# Patient Record
Sex: Female | Born: 1955 | Race: White | Hispanic: No | Marital: Married | Smoking: Former smoker
Health system: Southern US, Community
[De-identification: ages and names within clinical notes are randomized; demographics above are authoritative.]

## PROBLEM LIST (undated history)

## (undated) DIAGNOSIS — G629 Polyneuropathy, unspecified: Secondary | ICD-10-CM

## (undated) HISTORY — DX: Polyneuropathy, unspecified: G62.9

---

## 2014-07-24 ENCOUNTER — Other Ambulatory Visit (HOSPITAL_COMMUNITY): Payer: Self-pay | Admitting: Obstetrics and Gynecology

## 2014-07-24 DIAGNOSIS — E041 Nontoxic single thyroid nodule: Secondary | ICD-10-CM

## 2014-07-27 ENCOUNTER — Ambulatory Visit (HOSPITAL_COMMUNITY)
Admission: RE | Admit: 2014-07-27 | Discharge: 2014-07-27 | Disposition: A | Discharge status: Home / Self Care | Payer: Managed Care, Other (non HMO) | Source: Ambulatory Visit | Attending: Obstetrics and Gynecology | Admitting: Obstetrics and Gynecology

## 2014-07-27 DIAGNOSIS — E042 Nontoxic multinodular goiter: Secondary | ICD-10-CM | POA: Insufficient documentation

## 2014-07-27 DIAGNOSIS — E041 Nontoxic single thyroid nodule: Secondary | ICD-10-CM

## 2016-01-17 ENCOUNTER — Other Ambulatory Visit: Payer: Self-pay | Admitting: Urology

## 2016-01-17 DIAGNOSIS — D3500 Benign neoplasm of unspecified adrenal gland: Secondary | ICD-10-CM

## 2016-02-24 ENCOUNTER — Ambulatory Visit (HOSPITAL_COMMUNITY): Payer: Managed Care, Other (non HMO)

## 2016-03-02 ENCOUNTER — Ambulatory Visit (HOSPITAL_COMMUNITY): Payer: Managed Care, Other (non HMO)

## 2016-03-09 ENCOUNTER — Ambulatory Visit (HOSPITAL_COMMUNITY): Admission: RE | Admit: 2016-03-09 | Payer: Managed Care, Other (non HMO) | Source: Ambulatory Visit

## 2018-10-25 ENCOUNTER — Telehealth: Payer: Self-pay

## 2018-10-25 NOTE — Telephone Encounter (Signed)
NOTES ON FILE FROM Charlesetta Ivory MD [phone], REFERRAL SENT TO SCHEDULING

## 2018-12-27 ENCOUNTER — Encounter: Payer: Self-pay | Admitting: Cardiology

## 2018-12-27 ENCOUNTER — Ambulatory Visit (INDEPENDENT_AMBULATORY_CARE_PROVIDER_SITE_OTHER): Payer: Managed Care, Other (non HMO) | Admitting: Cardiology

## 2018-12-27 ENCOUNTER — Other Ambulatory Visit: Payer: Self-pay

## 2018-12-27 VITALS — BP 136/70 | HR 85 | Temp 97.1°F | Ht 71.5 in | Wt 182.0 lb

## 2018-12-27 DIAGNOSIS — Z131 Encounter for screening for diabetes mellitus: Secondary | ICD-10-CM | POA: Diagnosis not present

## 2018-12-27 DIAGNOSIS — Z7182 Exercise counseling: Secondary | ICD-10-CM

## 2018-12-27 DIAGNOSIS — G629 Polyneuropathy, unspecified: Secondary | ICD-10-CM | POA: Diagnosis not present

## 2018-12-27 DIAGNOSIS — I10 Essential (primary) hypertension: Secondary | ICD-10-CM

## 2018-12-27 DIAGNOSIS — Z79899 Other long term (current) drug therapy: Secondary | ICD-10-CM | POA: Diagnosis not present

## 2018-12-27 DIAGNOSIS — E782 Mixed hyperlipidemia: Secondary | ICD-10-CM

## 2018-12-27 DIAGNOSIS — E785 Hyperlipidemia, unspecified: Secondary | ICD-10-CM

## 2018-12-27 DIAGNOSIS — Z7189 Other specified counseling: Secondary | ICD-10-CM

## 2018-12-27 DIAGNOSIS — Z713 Dietary counseling and surveillance: Secondary | ICD-10-CM

## 2018-12-27 DIAGNOSIS — Z8249 Family history of ischemic heart disease and other diseases of the circulatory system: Secondary | ICD-10-CM

## 2018-12-27 MED ORDER — ATORVASTATIN CALCIUM 40 MG PO TABS: 40.0000 mg | ORAL_TABLET | Freq: Every day | ORAL | 3 refills | Status: DC

## 2018-12-27 NOTE — Patient Instructions (Addendum)
Medication Instructions:  Start 10 mg atorvastatin daily Week two, go to 20 mg daily Week three, go to 30 mg daily Week four, go to 40 mg daily  If you need a refill on your cardiac medications before your next appointment, please call your pharmacy.   Lab work: Your physician recommends that you return for lab work in 3 months ( Lipid, LFT, A1C)  If you have labs (blood work) drawn today and your tests are completely normal, you will receive your results only by: Marland Kitchen MyChart Message (if you have MyChart) OR . A paper copy in the mail If you have any lab test that is abnormal or we need to change your treatment, we will call you to review the results.  Testing/Procedures: None  Follow-Up: At [facility], you and your health needs are our priority.  As part of our continuing mission to provide you with exceptional heart care, we have created designated Provider Care Teams.  These Care Teams include your primary Cardiologist (physician) and Advanced Practice Providers (APPs -  Physician Assistants and Nurse Practitioners) who all work together to provide you with the care you need, when you need it. You will need a follow up appointment in 3 months.  Please call our office 2 months in advance to schedule this appointment.  You may see Dr. Harrell Gave or one of the following Advanced Practice Providers on your designated Care Team:   Rosaria Ferries, PA-C . Jory Sims, DNP, ANP

## 2018-12-27 NOTE — Progress Notes (Signed)
Cardiology Office Note:    Date:  12/27/2018   ID:  Kimberly Vaughan, DOB 1956-02-12, MRN [id number]  PCP:  Maryruth Eve, MD  Cardiologist:  Buford Dresser, MD  Referring MD: Maryruth Eve, MD   CC: new patient consult for hyperlipidemia  History of Present Illness:    Kimberly Vaughan is a 62 y.o. female with a hx of neuropathy, hypertriglyceridemia who is seen as a new consult at the request of Maryruth Eve, MD for the evaluation and management of hyperlipidemia.  Sees Dr. Helane Rima with [facility]. Has neuropathy in the feet, numb up to mid calves. 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 Also has thyroid nodules. Falls asleep easily on gabapentin, but feels she cannot take other than bedtime.   She is concerned as to whether her hypertriglyceridemia is the cause/result of any of her current issues.  CV history: Admitted to CCU for 2 days for abnomal ECG in 2014. Was told it was just stress.  Has been on atorvastatin and simvastatin in the past. Her last A1c was 5.2. Very nervous about getting diabetes. Walking frequently, 30 min/day. Wearing compression stockings. HTN: checks at home, 120/70s-80s  Denies chest pain, shortness of breath at rest or with normal exertion. No PND, orthopnea, LE edema or unexpected weight gain. No syncope or palpitations.  Family history: Brother had diabetes, LVAD. Brother with diabetes, dialysis, liver cancer. Father died 54 years ago, had diabetes and cholesterol issues.   Brings copies of labs with her: 2014 Tchol 223, TG 285, HDL 43, LDL 123 (before atorvastatin, fasting)  02/11/2015 Tchol 211, LDL 120, TG 218, HDL 43 (no meds, fasting)  10/17/18 Tchol 260, TG 520, HDL 36 (fasting, no meds)   We discussed CV risk, lipids at length today. Summary of plan below.  Past Medical History:  Diagnosis Date  .  Neuropathy     History reviewed. No pertinent surgical history.  Current Medications: Current Outpatient Medications on File Prior to Visit  Medication Sig  . Ascorbic Acid (VITAMIN C) 1000 MG tablet Take by mouth.  . estradiol (ESTRACE) 0.1 MG/GM vaginal cream Place vaginally.  Marland Kitchen FLUoxetine (PROZAC) 20 MG tablet   . gabapentin (NEURONTIN) 300 MG capsule Start with 300 mg at night x 1 week, then 300 mg twice daily x 1 week then 300 mg 3 times per day  . Multiple Vitamin (MULTIVITAMIN) capsule Take by mouth.  . Omega-3 1000 MG CAPS Take by mouth.   No current facility-administered medications on file prior to visit.      Allergies:   Latex   Social History   Tobacco Use  . Smoking status: Former Smoker    Quit date: 2010    Years since quitting: 10.7  . Smokeless tobacco: Never Used  Substance Use Topics  . Alcohol use: Not on file  . Drug use: Not on file    Family History: family history is not on file.  ROS:   Please see the history of present illness.  Additional pertinent ROS: Constitutional: Negative for chills, fever, night sweats, unintentional weight loss  HENT: Negative for ear pain and hearing loss.   Eyes: Negative for loss of vision and eye pain.  Respiratory: Negative for cough, sputum, wheezing.   Cardiovascular: See HPI. Gastrointestinal: Negative for abdominal pain, melena, and hematochezia.  Genitourinary: Negative for dysuria and hematuria.  Musculoskeletal: Negative for falls and myalgias.  Skin: Negative for itching and rash.  Neurological: Negative for focal weakness, focal sensory changes and loss of consciousness.  Endo/Heme/Allergies: Does not bruise/bleed easily.     EKGs/Labs/Other Studies Reviewed:    The following studies were reviewed today: No prior cardiac studies  EKG:  EKG is personally reviewed.  The ekg ordered today demonstrates NSR, borderline criteria for LVH  Recent Labs: No results found for requested labs within last  8760 hours.  Recent Lipid Panel No results found for: CHOL, TRIG, HDL, CHOLHDL, VLDL, LDLCALC, LDLDIRECT  Physical Exam:    VS:  BP 136/70   Pulse 85   Temp (!) 97.1 F (36.2 C)   Ht 5' 11.5" (1.816 m)   Wt 182 lb (82.6 kg)   SpO2 95%   BMI 25.03 kg/m     Wt Readings from Last 3 Encounters:  12/27/18 182 lb (82.6 kg)    GEN: Well nourished, well developed in no acute distress HEENT: Normal, moist mucous membranes NECK: No JVD CARDIAC: regular rhythm, normal S1 and S2, no murmurs, rubs, gallops.  VASCULAR: Radial and DP pulses 2+ bilaterally. No carotid bruits RESPIRATORY:  Clear to auscultation without rales, wheezing or rhonchi  ABDOMEN: Soft, non-tender, non-distended MUSCULOSKELETAL:  Ambulates independently SKIN: Warm and dry, no edema NEUROLOGIC:  Alert and oriented x 3. No focal neuro deficits noted. PSYCHIATRIC:  Normal affect    ASSESSMENT:    1. Mixed hyperlipidemia   2. Diabetes mellitus screening   3. Medication management   4. Neuropathy   5. Cardiac risk counseling   6. Counseling on health promotion and disease prevention   7. Nutritional counseling   8. Exercise counseling   9. Family history of heart disease   10. Essential hypertension    PLAN:    Hyperlipidemia: mixed hyperlipidemia -labs from 10/17/18: Tchol 260, TG 520, HDL 36 (fasting, no meds) -Will attempt gradual uptitration from 10 mg to 40 mg atorvastatin, see instructions below -discussed pathophysiology of elevated lipids today -discussed importance of lifestyle, including diet and exercise, for additional management. -her triglycerides especially are concerning for a metabolic syndrome picture. We will check A1c with future labs as a diabetes diagnosis would increase her CV risk. She does have a family history of CV disease including a brother with an LVAD.  Neuropathy: discussed today. Unlikely related to her cardiac conditions, but recommend she discuss further with her PCP   Hypertension: she is nervous today but reports excellent control at home -continue monitoring. Was on losartan in the past, not currently on antihypertensive  Cardiac risk counseling and prevention recommendations: -recommend heart healthy/Mediterranean diet, with whole grains, fruits, vegetable, fish, lean meats, nuts, and olive oil. Limit salt. -recommend moderate walking, 3-5 times/week for 30-50 minutes each session. Aim for at least 150 minutes.week. Goal should be pace of 3 miles/hours, or walking 1.5 miles in 30 minutes -recommend avoidance of tobacco products. Avoid excess alcohol. -ASCVD risk score: The 10-year ASCVD risk score Mikey Bussing DC Brooke Bonito., et al., 2013) is: 7.4%   Values used to calculate the score:     Age: 21 years     Sex: Female     Is Non-Hispanic African American: No     Diabetic: No     Tobacco smoker: No     Systolic Blood Pressure: [id number] mmHg     Is BP treated: No     HDL Cholesterol: 36 mg/dL     Total Cholesterol: 260 mg/dL    Plan for follow up: 3  mos, recheck labs at that time  Medication Adjustments/Labs and Tests Ordered: Current medicines are reviewed at length with the patient today.  Concerns regarding medicines are outlined above.  Orders Placed This Encounter  Procedures  . Lipid panel  . Hepatic function panel  . Hemoglobin A1c  . EKG 12-Lead   Meds ordered this encounter  Medications  . DISCONTD: atorvastatin (LIPITOR) 40 MG tablet    Sig: Take 1 tablet (40 mg total) by mouth daily.    Dispense:  90 tablet    Refill:  3    Patient Instructions  Medication Instructions:  Start 10 mg atorvastatin daily Week two, go to 20 mg daily Week three, go to 30 mg daily Week four, go to 40 mg daily  If you need a refill on your cardiac medications before your next appointment, please call your pharmacy.   Lab work: Your physician recommends that you return for lab work in 3 months ( Lipid, LFT, A1C)  If you have labs (blood work) drawn today and  your tests are completely normal, you will receive your results only by: Marland Kitchen MyChart Message (if you have MyChart) OR . A paper copy in the mail If you have any lab test that is abnormal or we need to change your treatment, we will call you to review the results.  Testing/Procedures: None  Follow-Up: At [facility], you and your health needs are our priority.  As part of our continuing mission to provide you with exceptional heart care, we have created designated Provider Care Teams.  These Care Teams include your primary Cardiologist (physician) and Advanced Practice Providers (APPs -  Physician Assistants and Nurse Practitioners) who all work together to provide you with the care you need, when you need it. You will need a follow up appointment in 3 months.  Please call our office 2 months in advance to schedule this appointment.  You may see Dr. Harrell Gave or one of the following Advanced Practice Providers on your designated Care Team:   Rosaria Ferries, PA-C . Jory Sims, DNP, ANP     Signed, Buford Dresser, MD PhD 12/27/2018 11:00 AM    [place]

## 2018-12-29 ENCOUNTER — Other Ambulatory Visit: Payer: Self-pay | Admitting: Cardiology

## 2018-12-29 ENCOUNTER — Telehealth: Payer: Self-pay | Admitting: Cardiology

## 2018-12-29 MED ORDER — ATORVASTATIN CALCIUM 40 MG PO TABS: 40.0000 mg | ORAL_TABLET | Freq: Every day | ORAL | 1 refills | Status: DC

## 2018-12-29 NOTE — Telephone Encounter (Signed)
New Message   Patient states that she does not take   docusate sodium (COLACE) 100 MG capsule  And would like it to be removed from her chart.   Patient also states that she uses:  Progesterone HLT 20 mg Applied on wrist each night  Estradiol 0.2 mg Applied 3 times a week

## 2018-12-29 NOTE — Telephone Encounter (Signed)
Updated medication list- added nonformulary for the compounded creams.

## 2018-12-29 NOTE — Telephone Encounter (Signed)
New Message   *STAT* If patient is at the pharmacy, call can be transferred to refill team.   1. Which medications need to be refilled? (please list name of each medication and dose if known) atorvastatin (LIPITOR) 40 MG tablet  2. Which pharmacy/location (including street and city if local pharmacy) is medication to be sent to? [id number] - [place], [place] - [address]  3. Do they need a 30 day or 90 day supply? 90 day

## 2018-12-29 NOTE — Telephone Encounter (Signed)
Rx request sent to pharmacy.  

## 2019-01-18 ENCOUNTER — Encounter: Payer: Self-pay | Admitting: Cardiology

## 2019-01-18 DIAGNOSIS — G629 Polyneuropathy, unspecified: Secondary | ICD-10-CM | POA: Insufficient documentation

## 2019-01-27 DIAGNOSIS — E782 Mixed hyperlipidemia: Secondary | ICD-10-CM | POA: Insufficient documentation

## 2019-01-27 DIAGNOSIS — I10 Essential (primary) hypertension: Secondary | ICD-10-CM | POA: Insufficient documentation

## 2019-01-27 DIAGNOSIS — Z8249 Family history of ischemic heart disease and other diseases of the circulatory system: Secondary | ICD-10-CM | POA: Insufficient documentation

## 2019-03-08 LAB — HEPATIC FUNCTION PANEL
ALT: 23 IU/L (ref 0–32)
AST: 23 IU/L (ref 0–40)
Albumin: 4.1 g/dL (ref 3.8–4.8)
Alkaline Phosphatase: 97 IU/L (ref 39–117)
Bilirubin Total: 0.7 mg/dL (ref 0.0–1.2)
Bilirubin, Direct: 0.16 mg/dL (ref 0.00–0.40)
Total Protein: 7.2 g/dL (ref 6.0–8.5)

## 2019-03-08 LAB — LIPID PANEL
Chol/HDL Ratio: 3.7 ratio (ref 0.0–4.4)
Cholesterol, Total: 151 mg/dL (ref 100–199)
HDL: 41 mg/dL (ref >39)
LDL Chol Calc (NIH): 72 mg/dL (ref 0–99)
Triglycerides: 231 mg/dL — ABNORMAL HIGH (ref 0–149)
VLDL Cholesterol Cal: 38 mg/dL (ref 5–40)

## 2019-03-08 LAB — HEMOGLOBIN A1C
Est. average glucose Bld gHb Est-mCnc: 105 mg/dL
Hgb A1c MFr Bld: 5.3 % (ref 4.8–5.6)

## 2019-03-14 ENCOUNTER — Encounter: Payer: Self-pay | Admitting: Cardiology

## 2019-03-14 ENCOUNTER — Other Ambulatory Visit: Payer: Self-pay

## 2019-03-14 ENCOUNTER — Ambulatory Visit (INDEPENDENT_AMBULATORY_CARE_PROVIDER_SITE_OTHER): Payer: Managed Care, Other (non HMO) | Admitting: Cardiology

## 2019-03-14 VITALS — BP 142/90 | HR 87 | Temp 97.2°F | Ht 71.0 in | Wt 185.4 lb

## 2019-03-14 DIAGNOSIS — R030 Elevated blood-pressure reading, without diagnosis of hypertension: Secondary | ICD-10-CM | POA: Diagnosis not present

## 2019-03-14 DIAGNOSIS — Z79899 Other long term (current) drug therapy: Secondary | ICD-10-CM | POA: Diagnosis not present

## 2019-03-14 DIAGNOSIS — Z7189 Other specified counseling: Secondary | ICD-10-CM

## 2019-03-14 DIAGNOSIS — E782 Mixed hyperlipidemia: Secondary | ICD-10-CM | POA: Diagnosis not present

## 2019-03-14 NOTE — Progress Notes (Signed)
Cardiology Office Note:    Date:  03/14/2019   ID:  Kimberly Vaughan, DOB 1955-09-08, MRN [id number]  PCP:  Maryruth Eve, MD  Cardiologist:  Buford Dresser, MD  Referring MD: Maryruth Eve, MD   CC: follow up  History of Present Illness:    Kimberly Vaughan is a 63 y.o. female with a hx of neuropathy, hypertriglyceridemia who is seen for follow up today. I initially saw her 12/27/2018 as a new consult at the request of Maryruth Eve, MD for the evaluation and management of hyperlipidemia.  CV history: Admitted to CCU for 2 days for abnomal ECG in 2014. Was told it was just stress. Has been on atorvastatin and simvastatin in the past. Her last A1c was 5.2. Very nervous about getting diabetes. Walking frequently, 30 min/day. Wearing compression stockings. HTN: checks at home, 120/70s-80s Family history: Brother had diabetes, LVAD. Brother with diabetes, dialysis, liver cancer. Father died 90 years ago, had diabetes and cholesterol issues.   Today: Reviewed her lab results today. Discussed hypertriglyceridemia at length, management options. Lost her brother recently, has struggled with the pandemic.   BP typically well controlled. Avg 117/82. Has a history of white coat hypertension chronically, always higher in the office.  Denies chest pain, shortness of breath at rest or with normal exertion. No PND, orthopnea, LE edema or unexpected weight gain. No syncope or palpitations.  Past Medical History:  Diagnosis Date  . Neuropathy     No past surgical history on file.  Current Medications: Current Outpatient Medications on File Prior to Visit  Medication Sig  . Ascorbic Acid (VITAMIN C) 1000 MG tablet Take by mouth.  Marland Kitchen atorvastatin (LIPITOR) 40 MG tablet Take 1 tablet (40 mg total) by mouth daily.  Marland Kitchen estradiol (ESTRACE) 0.1 MG/GM vaginal cream Place vaginally.  Marland Kitchen FLUoxetine (PROZAC) 20 MG tablet   . gabapentin (NEURONTIN) 300 MG capsule Start with 300 mg at  night x 1 week, then 300 mg twice daily x 1 week then 300 mg 3 times per day  . Multiple Vitamin (MULTIVITAMIN) capsule Take by mouth.  . NON FORMULARY Compounded progesterone cream 2% Apply 1 ml topical to inner arms at bedtime  . NON FORMULARY Compounded estradiol cream/ 0.02% Insert 1 ml vaginally 3 times per week # 30 gm  . Omega-3 1000 MG CAPS Take by mouth.   No current facility-administered medications on file prior to visit.     Allergies:   Latex   Social History   Tobacco Use  . Smoking status: Former Smoker    Quit date: 2010    Years since quitting: 10.9  . Smokeless tobacco: Never Used  Substance Use Topics  . Alcohol use: Not on file  . Drug use: Not on file    Family History: family history is not on file.  ROS:   Please see the history of present illness.  Additional pertinent ROS: Constitutional: Negative for chills, fever, night sweats, unintentional weight loss  HENT: Negative for ear pain and hearing loss.   Eyes: Negative for loss of vision and eye pain.  Respiratory: Negative for cough, sputum, wheezing.   Cardiovascular: See HPI. Gastrointestinal: Negative for abdominal pain, melena, and hematochezia.  Genitourinary: Negative for dysuria and hematuria.  Musculoskeletal: Negative for falls and myalgias.  Skin: Negative for itching and rash.  Neurological: Negative for focal weakness, focal sensory changes and loss of consciousness.  Endo/Heme/Allergies: Does not bruise/bleed easily.   EKGs/Labs/Other Studies Reviewed:    The following studies were  reviewed today: No prior cardiac studies  EKG:  EKG is personally reviewed.  The ekg ordered 01/19/19 demonstrates NSR, borderline criteria for LVH  Recent Labs: 03/07/2019: ALT 23  Recent Lipid Panel    Component Value Date/Time   CHOL 151 03/07/2019 1408   TRIG 231 (H) 03/07/2019 1408   HDL 41 03/07/2019 1408   CHOLHDL 3.7 03/07/2019 1408   LDLCALC 72 03/07/2019 1408    Physical Exam:     VS:  BP (!) 142/90 (BP Location: Right Arm, Patient Position: Sitting, Cuff Size: Normal)   Pulse 87   Temp (!) 97.2 F (36.2 C)   Ht 5\' 11"  (1.803 m)   Wt 185 lb 6.4 oz (84.1 kg)   SpO2 96%   BMI 25.86 kg/m     Wt Readings from Last 3 Encounters:  03/14/19 185 lb 6.4 oz (84.1 kg)  12/27/18 182 lb (82.6 kg)    GEN: Well nourished, well developed in no acute distress HEENT: Normal, moist mucous membranes NECK: No JVD CARDIAC: regular rhythm, normal S1 and S2, no rubs or gallops. No murmur. VASCULAR: Radial and DP pulses 2+ bilaterally. No carotid bruits RESPIRATORY:  Clear to auscultation without rales, wheezing or rhonchi  ABDOMEN: Soft, non-tender, non-distended MUSCULOSKELETAL:  Ambulates independently SKIN: Warm and dry, no edema NEUROLOGIC:  Alert and oriented x 3. No focal neuro deficits noted. PSYCHIATRIC:  Normal affect   ASSESSMENT:    1. Mixed hyperlipidemia   2. Medication management   3. Cardiac risk counseling   4. Counseling on health promotion and disease prevention   5. Essential hypertension    PLAN:    Hyperlipidemia: mixed hyperlipidemia -labs from 10/17/18: Tchol 260, TG 520, HDL 36 (fasting, no meds) -recheck labs show TG improved from 520 to 231 -tolerating atorvastatin 40 mg -discussed options today. Discussed uptitration of statin, role of omega 3s and data for/against OTC vs. Prescription, lifestyle, etc. -she wants to continue to work on diet/lifestyle first before further adjusting medications.   White coat syndrome, with elevated blood pressure: reports excellent control at home -continue monitoring. Was on losartan in the past, not currently on antihypertensive  Cardiac risk counseling and prevention recommendations: -recommend heart healthy/Mediterranean diet, with whole grains, fruits, vegetable, fish, lean meats, nuts, and olive oil. Limit salt. -recommend moderate walking, 3-5 times/week for 30-50 minutes each session. Aim for at least  150 minutes.week. Goal should be pace of 3 miles/hours, or walking 1.5 miles in 30 minutes -recommend avoidance of tobacco products. Avoid excess alcohol. -ASCVD risk score: The 10-year ASCVD risk score Mikey Bussing DC Brooke Bonito., et al., 2013) is: 5%   Values used to calculate the score:     Age: 75 years     Sex: Female     Is Non-Hispanic African American: No     Diabetic: No     Tobacco smoker: No     Systolic Blood Pressure: [id number] mmHg     Is BP treated: No     HDL Cholesterol: 41 mg/dL     Total Cholesterol: 151 mg/dL    Plan for follow up: 6 mos  TIME SPENT WITH PATIENT: 25 minutes of direct patient care. More than 50% of that time was spent on coordination of care and counseling regarding triglyceride, goal numbers, treatment options and pros/con, shared decision making.  Buford Dresser, MD, PhD [place]  [facility]   Medication Adjustments/Labs and Tests Ordered: Current medicines are reviewed at length with the patient today.  Concerns regarding medicines  are outlined above.  No orders of the defined types were placed in this encounter.  No orders of the defined types were placed in this encounter.   Patient Instructions  Medication Instructions:  Your Physician recommend you continue on your current medication as directed.    *If you need a refill on your cardiac medications before your next appointment, please call your pharmacy*  Lab Work: None  Testing/Procedures: None  Follow-Up: At [facility], you and your health needs are our priority.  As part of our continuing mission to provide you with exceptional heart care, we have created designated Provider Care Teams.  These Care Teams include your primary Cardiologist (physician) and Advanced Practice Providers (APPs -  Physician Assistants and Nurse Practitioners) who all work together to provide you with the care you need, when you need it.  Your next appointment:   6 month(s)  The format for your next  appointment:   In Person  Provider:   Buford Dresser, MD    Signed, Buford Dresser, MD PhD 03/14/2019  [place]

## 2019-03-14 NOTE — Patient Instructions (Signed)

## 2019-03-16 ENCOUNTER — Encounter: Payer: Self-pay | Admitting: Cardiology

## 2019-07-31 ENCOUNTER — Other Ambulatory Visit: Payer: Self-pay | Admitting: Cardiology

## 2019-09-28 ENCOUNTER — Ambulatory Visit: Payer: Managed Care, Other (non HMO) | Admitting: Cardiology

## 2019-10-31 ENCOUNTER — Other Ambulatory Visit: Payer: Self-pay | Admitting: Obstetrics and Gynecology

## 2019-10-31 ENCOUNTER — Other Ambulatory Visit (HOSPITAL_COMMUNITY): Payer: Self-pay | Admitting: Obstetrics and Gynecology

## 2019-10-31 DIAGNOSIS — E041 Nontoxic single thyroid nodule: Secondary | ICD-10-CM

## 2019-11-06 ENCOUNTER — Other Ambulatory Visit: Payer: Self-pay

## 2019-11-06 ENCOUNTER — Encounter: Payer: Self-pay | Admitting: Cardiology

## 2019-11-06 ENCOUNTER — Ambulatory Visit (INDEPENDENT_AMBULATORY_CARE_PROVIDER_SITE_OTHER): Payer: Managed Care, Other (non HMO) | Admitting: Cardiology

## 2019-11-06 VITALS — BP 130/80 | HR 82 | Temp 97.2°F | Ht 71.0 in | Wt 187.0 lb

## 2019-11-06 DIAGNOSIS — R030 Elevated blood-pressure reading, without diagnosis of hypertension: Secondary | ICD-10-CM

## 2019-11-06 DIAGNOSIS — E782 Mixed hyperlipidemia: Secondary | ICD-10-CM | POA: Diagnosis not present

## 2019-11-06 DIAGNOSIS — Z7189 Other specified counseling: Secondary | ICD-10-CM | POA: Diagnosis not present

## 2019-11-06 NOTE — Progress Notes (Signed)
Cardiology Office Note:    Date:  11/06/2019   ID:  Kimberly Vaughan, DOB Jul 17, 1955, MRN [id number]  PCP:  Kimberly Eve, MD  Cardiologist:  Buford Dresser, MD  Referring MD: Kimberly Eve, MD   CC: follow up  History of Present Illness:    Kimberly Vaughan is a 64 y.o. female with a hx of neuropathy, hypertriglyceridemia who is seen for follow up today. I initially saw her 12/27/2018 as a new consult at the request of Kimberly Eve, MD for the evaluation and management of hyperlipidemia.  CV history: Admitted to CCU for 2 days for abnomal ECG in 2014. Was told it was just stress. Has been on atorvastatin and simvastatin in the past. Her last A1c was 5.2. Very nervous about getting diabetes. Walking frequently, 30 min/day. Wearing compression stockings. HTN: checks at home, 120/70s-80s Family history: Brother had diabetes, LVAD. Brother with diabetes, dialysis, liver cancer. Father died 44 years ago, had diabetes and cholesterol issues.   Today: Lost her mom recently, offered my condolences.   Has gained about 20 lbs through the pandemic.   Has had intermittent elevated blood pressure at home. One visit with her PCP the diastolic was 194. Typically when she checks at home it is better controlled.   Having thyroid nodules scanned soon, reports that her recent thyroid tests were normal.  Brings lipid results from recent PCP visit, dated 10/30/19: Tchol 184, TG 330, HDL 42, LDL 88.  cmp normal, thyroid studies normal, reviewed in her portal on her phone.  Still with bilateral lower extremity neuropathy, started in 2013 but worsened in the last two years. Has burning, pins/needles. Wonders if she will always have this. Never had carpal tunnel. No history of kidney dysfunction (has kidney stones), but both brothers on dialysis due to dialysis. Older brother had a heart transplant. No diabetes, has glucometer at home that she uses periodically, normal. Does have RA. Had  testing last year that confirmed advanced small fiber neuropathy. Summarized in note by Dr. Bjorn Loser in Care Everywhere 04/25/19.  Past Medical History:  Diagnosis Date  . Neuropathy     No past surgical history on file.  Current Medications: Current Outpatient Medications on File Prior to Visit  Medication Sig  . Ascorbic Acid (VITAMIN C) 1000 MG tablet Take by mouth.  Marland Kitchen atorvastatin (LIPITOR) 40 MG tablet TAKE 1 TABLET BY MOUTH EVERY DAY  . Coenzyme Q10-Vitamin E 100-300 MG-UNIT WAFR Take by mouth.  . estradiol (ESTRACE) 0.1 MG/GM vaginal cream Place vaginally.  Marland Kitchen FLUoxetine (PROZAC) 20 MG tablet   . Fluoxetine HCl, PMDD, 20 MG TABS Take 1.5 tablets by mouth daily.  Marland Kitchen gabapentin (NEURONTIN) 300 MG capsule Start with 300 mg at night x 1 week, then 300 mg twice daily x 1 week then 300 mg 3 times per day  . hydroxychloroquine (PLAQUENIL) 200 MG tablet Take 200 mg by mouth 2 (two) times daily.  Marland Kitchen losartan (COZAAR) 25 MG tablet losartan 25 mg tablet  Take 1 tablet every day by oral route.  . Multiple Vitamin (MULTIVITAMIN) capsule Take by mouth.  . NON FORMULARY Compounded progesterone cream 2% Apply 1 ml topical to inner arms at bedtime  . NON FORMULARY Compounded estradiol cream/ 0.02% Insert 1 ml vaginally 3 times per week # 30 gm  . Omega-3 1000 MG CAPS Take by mouth.   No current facility-administered medications on file prior to visit.     Allergies:   Latex   Social History   Tobacco Use  .  Smoking status: Former Smoker    Quit date: 2010    Years since quitting: 11.6  . Smokeless tobacco: Never Used  Substance Use Topics  . Alcohol use: Not on file  . Drug use: Not on file    Family History: both brothers on dialysis due to dialysis. Older brother had a heart transplant.   ROS:   Please see the history of present illness.  Additional pertinent ROS otherwise unremarkable.  EKGs/Labs/Other Studies Reviewed:    The following studies were reviewed today: No prior  cardiac studies  EKG:  EKG is personally reviewed.  The ekg ordered today demonstrates NSR, 82 bpm  Recent Labs: 03/07/2019: ALT 23  Recent Lipid Panel    Component Value Date/Time   CHOL 151 03/07/2019 1408   TRIG 231 (H) 03/07/2019 1408   HDL 41 03/07/2019 1408   CHOLHDL 3.7 03/07/2019 1408   LDLCALC 72 03/07/2019 1408    Physical Exam:    VS:  BP 130/80   Pulse 82   Temp (!) 97.2 F (36.2 C)   Ht 5\' 11"  (1.803 m)   Wt 187 lb (84.8 kg)   SpO2 98%   BMI 26.08 kg/m     Wt Readings from Last 3 Encounters:  11/06/19 187 lb (84.8 kg)  03/14/19 185 lb 6.4 oz (84.1 kg)  12/27/18 182 lb (82.6 kg)    GEN: Well nourished, well developed in no acute distress HEENT: Normal, moist mucous membranes NECK: No JVD CARDIAC: regular rhythm, normal S1 and S2, no rubs or gallops. No murmur. VASCULAR: Radial and DP pulses 2+ bilaterally. No carotid bruits RESPIRATORY:  Clear to auscultation without rales, wheezing or rhonchi  ABDOMEN: Soft, non-tender, non-distended MUSCULOSKELETAL:  Ambulates independently SKIN: Warm and dry, no edema NEUROLOGIC:  Alert and oriented x 3. No focal neuro deficits noted. PSYCHIATRIC:  Normal affect   ASSESSMENT:    1. Mixed hyperlipidemia   2. White coat syndrome with high blood pressure without hypertension   3. Cardiac risk counseling   4. Counseling on health promotion and disease prevention    PLAN:    Hyperlipidemia: mixed hyperlipidemia -labs from 10/17/18: Tchol 260, TG 520, HDL 36 (fasting, no meds). recheck labs show TG improved from 520 to 231 -labs from 10/30/19: Tchol 184, TG 330, HDL 42, LDL 88. -tolerating atorvastatin 40 mg -discussed options today. Discussed uptitration of statin, role of omega 3s and data for/against OTC vs. Prescription, lifestyle, etc. -she has gained about 20 lbs with the pandemic. Wants to work on losing this, then recheck lipids before discussing medications  White coat syndrome, with elevated blood pressure:   -continue monitoring. At goal today -Was on losartan in the past, not currently on antihypertensive  Cardiac risk counseling and prevention recommendations: -recommend heart healthy/Mediterranean diet, with whole grains, fruits, vegetable, fish, lean meats, nuts, and olive oil. Limit salt. -recommend moderate walking, 3-5 times/week for 30-50 minutes each session. Aim for at least 150 minutes.week. Goal should be pace of 3 miles/hours, or walking 1.5 miles in 30 minutes -recommend avoidance of tobacco products. Avoid excess alcohol. -ASCVD risk score: The 10-year ASCVD risk score Mikey Bussing DC Brooke Bonito., et al., 2013) is: 6.8%   Values used to calculate the score:     Age: 45 years     Sex: Female     Is Non-Hispanic African American: No     Diabetic: No     Tobacco smoker: No     Systolic Blood Pressure: 735 mmHg  Is BP treated: Yes     HDL Cholesterol: 42 mg/dL     Total Cholesterol: 184 mg/dL    Plan for follow up: 6 mos  Buford Dresser, MD, PhD [place]  [facility]   Medication Adjustments/Labs and Tests Ordered: Current medicines are reviewed at length with the patient today.  Concerns regarding medicines are outlined above.  Orders Placed This Encounter  Procedures  . EKG 12-Lead   No orders of the defined types were placed in this encounter.   Patient Instructions  Medication Instructions:  Your Physician recommend you continue on your current medication as directed.     If blood pressure at home consistently >140/90, then would restart losartan 25 mg daily. Otherwise would not restart at this time.  *If you need a refill on your cardiac medications before your next appointment, please call your pharmacy*   Lab Work: None   Testing/Procedures: None   Follow-Up: At [facility], you and your health needs are our priority.  As part of our continuing mission to provide you with exceptional heart care, we have created designated Provider Care Teams.   These Care Teams include your primary Cardiologist (physician) and Advanced Practice Providers (APPs -  Physician Assistants and Nurse Practitioners) who all work together to provide you with the care you need, when you need it.  We recommend signing up for the patient portal called "MyChart".  Sign up information is provided on this After Visit Summary.  MyChart is used to connect with patients for Virtual Visits (Telemedicine).  Patients are able to view lab/test results, encounter notes, upcoming appointments, etc.  Non-urgent messages can be sent to your provider as well.   To learn more about what you can do with MyChart, go to [email or web address].    Your next appointment:   6 month(s)  The format for your next appointment:   In Person  Provider:   Buford Dresser, MD     Signed, Buford Dresser, MD PhD 11/06/2019  [place]

## 2019-11-06 NOTE — Patient Instructions (Addendum)
Medication Instructions:  Your Physician recommend you continue on your current medication as directed.     If blood pressure at home consistently >140/90, then would restart losartan 25 mg daily. Otherwise would not restart at this time.  *If you need a refill on your cardiac medications before your next appointment, please call your pharmacy*   Lab Work: None   Testing/Procedures: None   Follow-Up: At [facility], you and your health needs are our priority.  As part of our continuing mission to provide you with exceptional heart care, we have created designated Provider Care Teams.  These Care Teams include your primary Cardiologist (physician) and Advanced Practice Providers (APPs -  Physician Assistants and Nurse Practitioners) who all work together to provide you with the care you need, when you need it.  We recommend signing up for the patient portal called "MyChart".  Sign up information is provided on this After Visit Summary.  MyChart is used to connect with patients for Virtual Visits (Telemedicine).  Patients are able to view lab/test results, encounter notes, upcoming appointments, etc.  Non-urgent messages can be sent to your provider as well.   To learn more about what you can do with MyChart, go to [email or web address].    Your next appointment:   6 month(s)  The format for your next appointment:   In Person  Provider:   Buford Dresser, MD

## 2019-12-06 ENCOUNTER — Ambulatory Visit (HOSPITAL_COMMUNITY): Payer: Managed Care, Other (non HMO)

## 2019-12-20 ENCOUNTER — Other Ambulatory Visit: Payer: Self-pay

## 2019-12-20 ENCOUNTER — Ambulatory Visit (HOSPITAL_COMMUNITY)
Admission: RE | Admit: 2019-12-20 | Discharge: 2019-12-20 | Disposition: A | Discharge status: Home / Self Care | Payer: Managed Care, Other (non HMO) | Source: Ambulatory Visit | Attending: Obstetrics and Gynecology | Admitting: Obstetrics and Gynecology

## 2019-12-20 DIAGNOSIS — E041 Nontoxic single thyroid nodule: Secondary | ICD-10-CM | POA: Diagnosis present

## 2020-01-01 ENCOUNTER — Encounter: Payer: Self-pay | Admitting: Cardiology

## 2020-11-06 IMAGING — US US THYROID
1 series · 14 of 25 positions shown · non-contrast
Comparison: Prior thyroid ultrasound 07/27/2014

CLINICAL DATA: Goiter.

EXAM:
THYROID ULTRASOUND
TECHNIQUE: Ultrasound examination of the thyroid gland and adjacent soft
tissues was performed.

[Series 1: us thyroid · 14 of 32 slices shown]
[im 1/32]
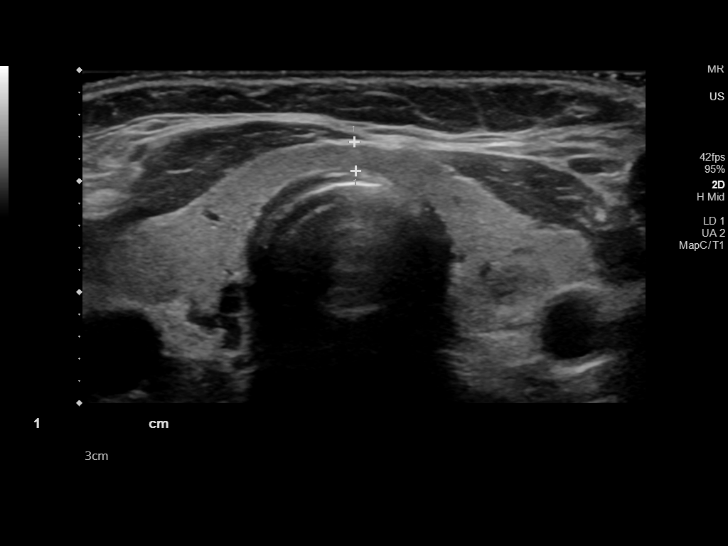
[im 3/32]
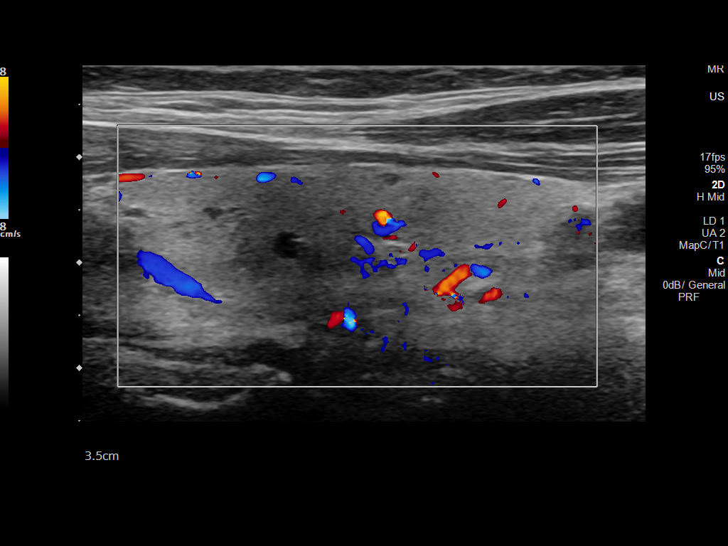
[im 6/32]
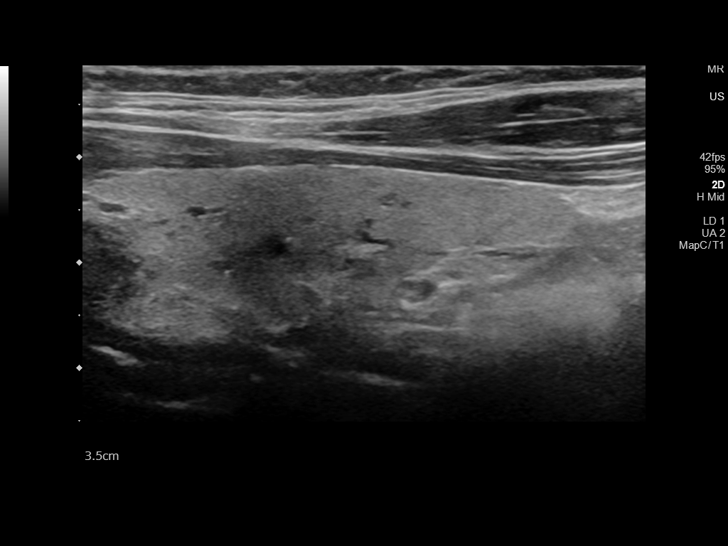
[im 8/32]
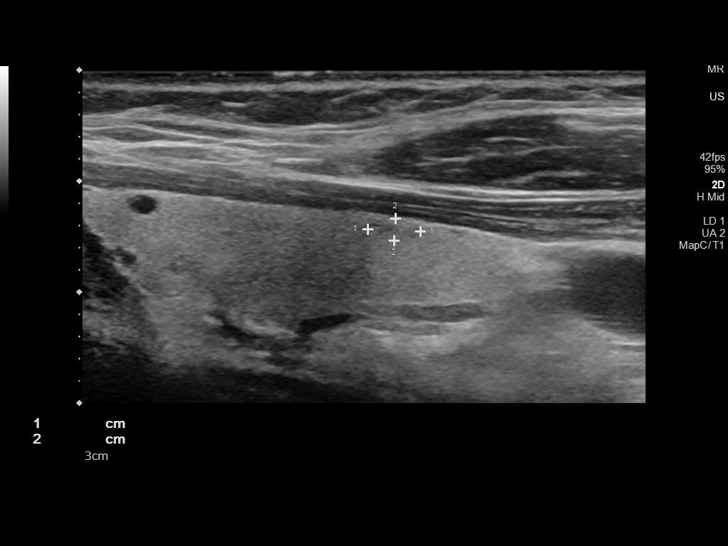
[im 11/32]
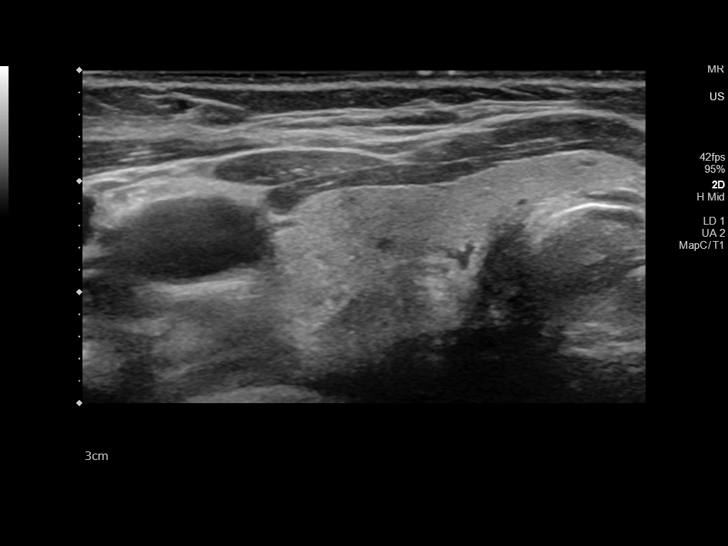
[im 12/32]
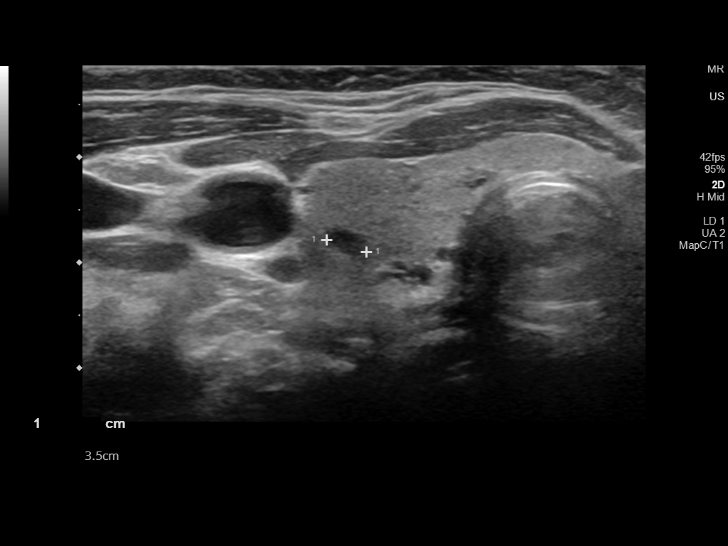
[im 15/32]
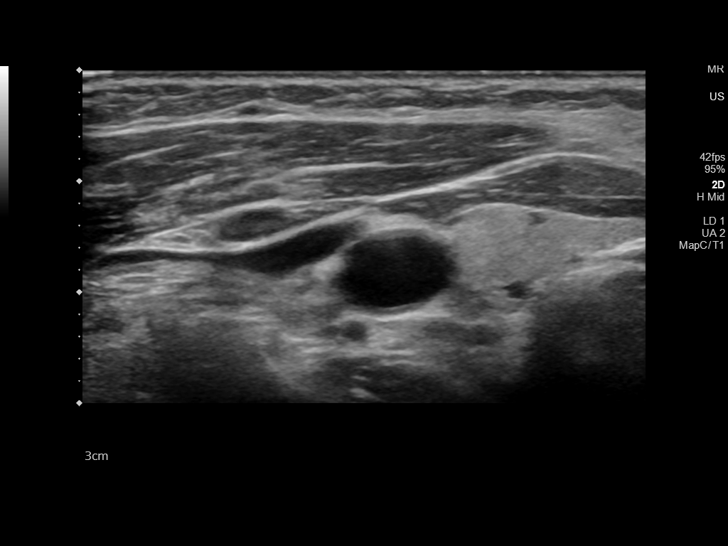
[im 17/32]
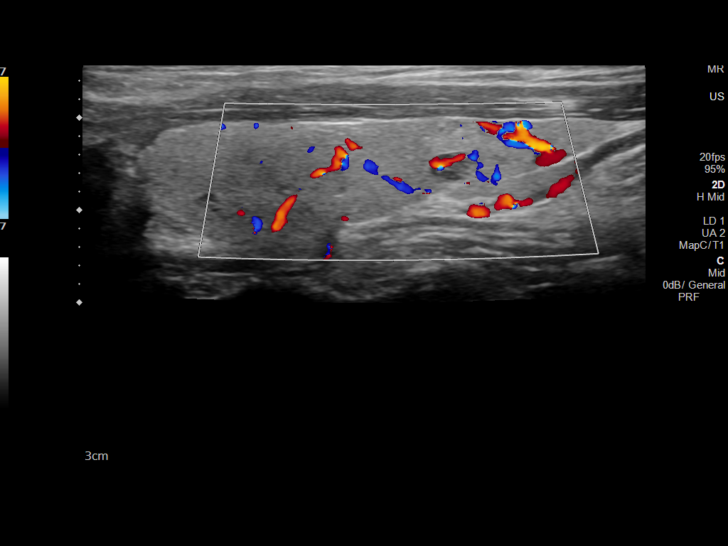
[im 20/32]
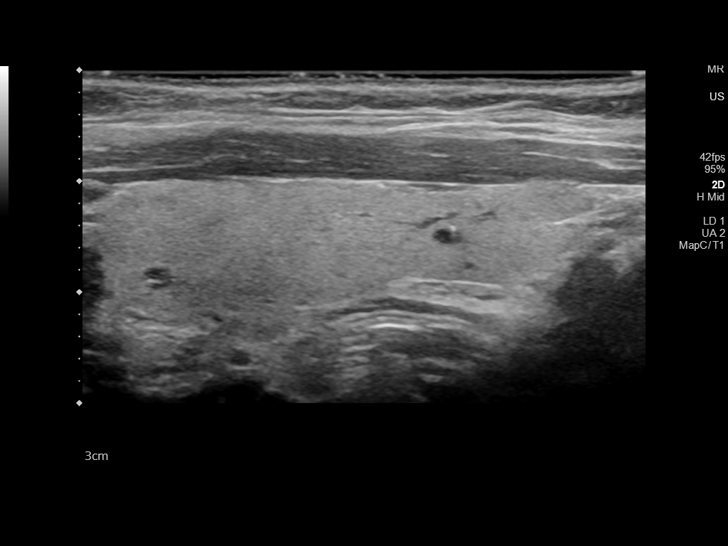
[im 21/32]
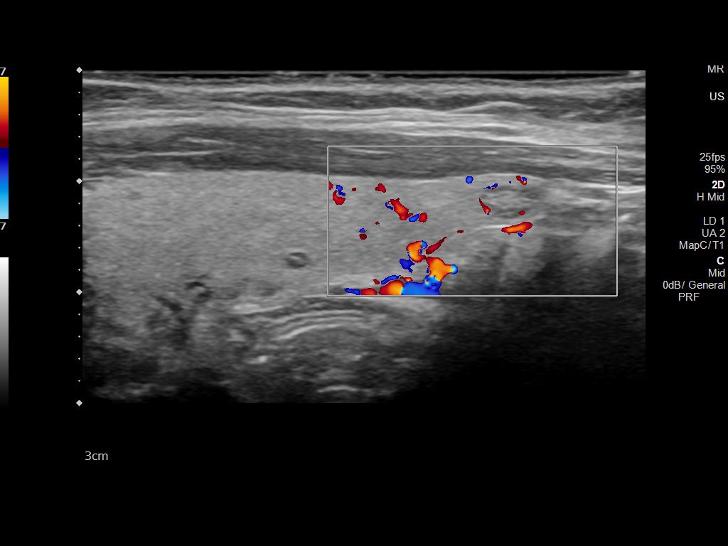
[im 24/32]
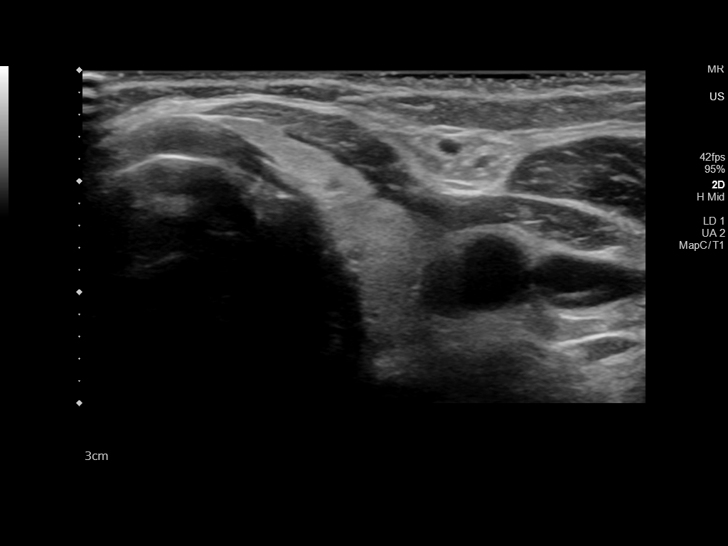
[im 26/32]
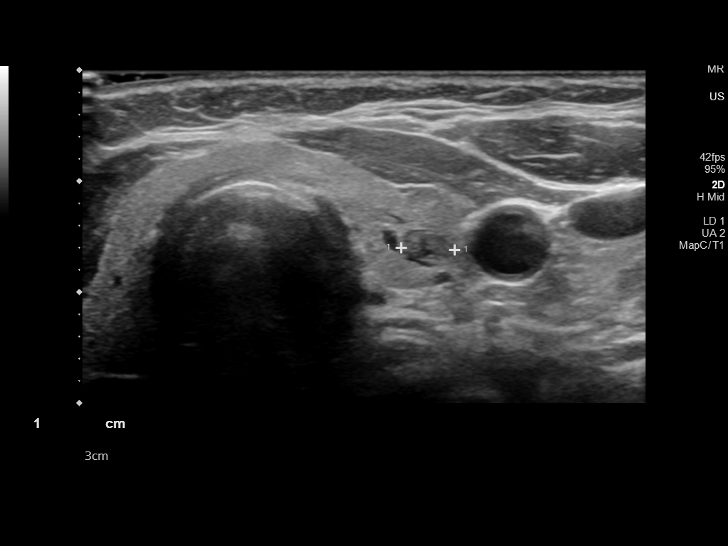
[im 29/32]
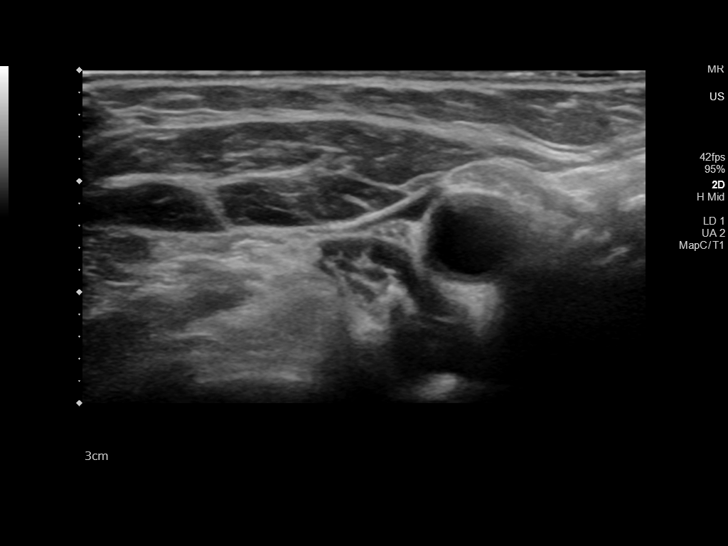
[im 32/32]
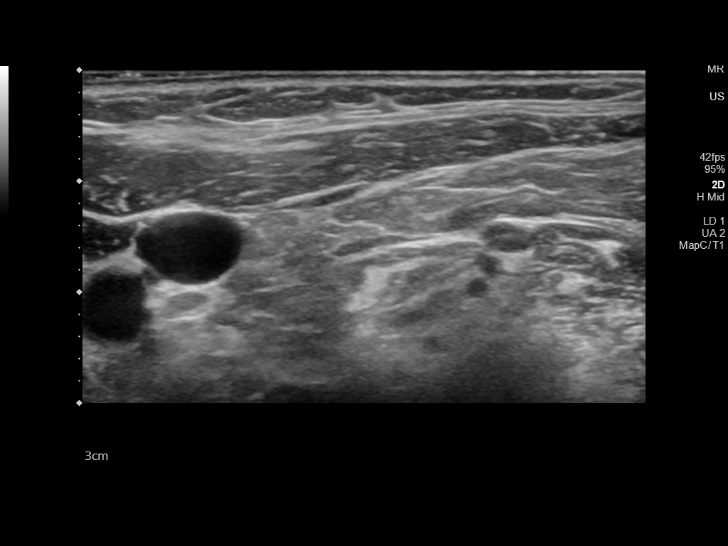

[14 of 25 positions shown; findings below may reference images not displayed]

FINDINGS: Parenchymal Echotexture: Normal

Isthmus: 0.3 cm

Right lobe: 4.9 x 1.4 x 1.7 cm

Left lobe: 5.0 x 1.3 x 1.1 cm

_________________________________________________________

Estimated total number of nodules >/= 1 cm: 0

Number of spongiform nodules >/=  2 cm not described below (TR1): 0

Number of mixed cystic and solid nodules >/= 1.5 cm not described
below (TR2): 0

_________________________________________________________

No discrete nodules of consequence are seen within the thyroid
gland. Scattered tiny subcentimeter hypoechoic nodules are noted
incidentally bilaterally. These warrant no further evaluation.
IMPRESSION: Sonographic appearance of the thyroid gland is essentially within
normal limits. Incidentally noted tiny thyroid nodules do not meet
criteria to warrant further evaluation.

The above is in keeping with the ACR TI-RADS recommendations - [HOSPITAL] 9200;[DATE].

## 2020-12-10 ENCOUNTER — Other Ambulatory Visit: Payer: Self-pay | Admitting: Obstetrics and Gynecology

## 2020-12-10 ENCOUNTER — Other Ambulatory Visit (HOSPITAL_COMMUNITY): Payer: Self-pay | Admitting: Obstetrics and Gynecology

## 2020-12-10 DIAGNOSIS — R7989 Other specified abnormal findings of blood chemistry: Secondary | ICD-10-CM

## 2020-12-24 ENCOUNTER — Ambulatory Visit (HOSPITAL_COMMUNITY)
Admission: RE | Admit: 2020-12-24 | Discharge: 2020-12-24 | Disposition: A | Discharge status: Home / Self Care | Payer: Medicare Other | Source: Ambulatory Visit | Attending: Obstetrics and Gynecology | Admitting: Obstetrics and Gynecology

## 2020-12-24 DIAGNOSIS — R7989 Other specified abnormal findings of blood chemistry: Secondary | ICD-10-CM | POA: Diagnosis not present

## 2020-12-25 ENCOUNTER — Ambulatory Visit (HOSPITAL_COMMUNITY): Payer: Managed Care, Other (non HMO)

## 2021-09-05 ENCOUNTER — Other Ambulatory Visit: Payer: Self-pay | Admitting: Obstetrics and Gynecology

## 2021-09-05 DIAGNOSIS — R1011 Right upper quadrant pain: Secondary | ICD-10-CM

## 2021-09-08 ENCOUNTER — Other Ambulatory Visit: Payer: Medicare Other

## 2021-09-09 ENCOUNTER — Ambulatory Visit
Admission: RE | Admit: 2021-09-09 | Discharge: 2021-09-09 | Disposition: A | Discharge status: Home / Self Care | Payer: Medicare Other | Source: Ambulatory Visit | Attending: Obstetrics and Gynecology | Admitting: Obstetrics and Gynecology

## 2021-09-09 DIAGNOSIS — R1011 Right upper quadrant pain: Secondary | ICD-10-CM

## 2021-11-11 IMAGING — US US RENAL
1 series · 15 of 25 positions shown · non-contrast
Comparison: None.

CLINICAL DATA: Other specified abnormal findings of blood chemistry

EXAM:
RENAL / URINARY TRACT ULTRASOUND COMPLETE

[Series 1: us renal mc & wl · 15 of 36 slices shown]
[im 1/36]
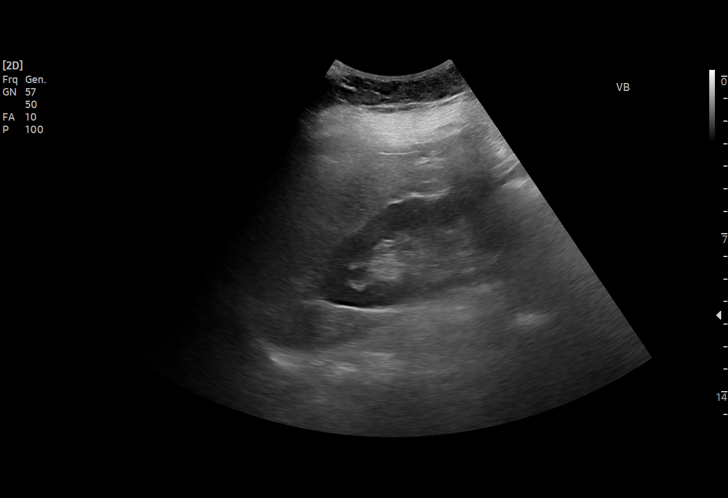
[im 3/36]
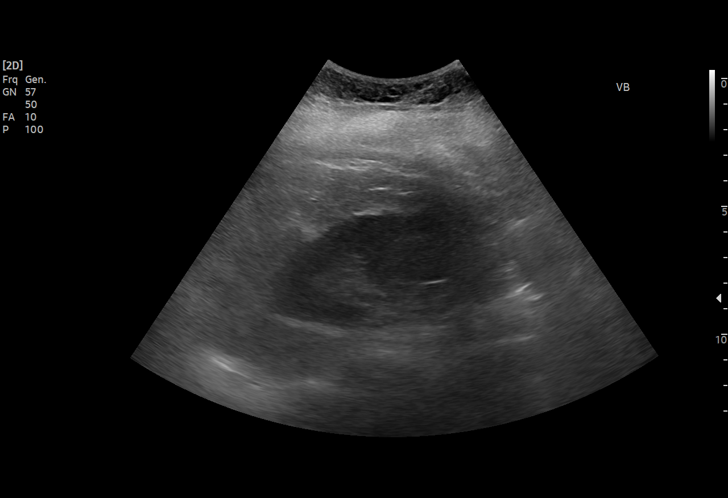
[im 6/36]
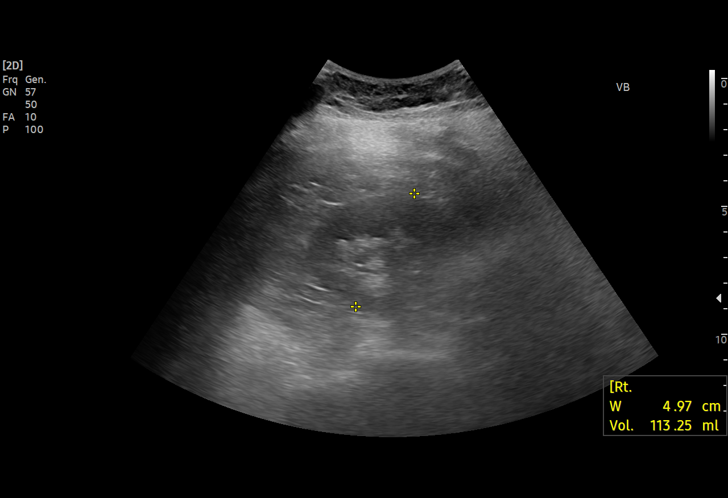
[im 8/36]
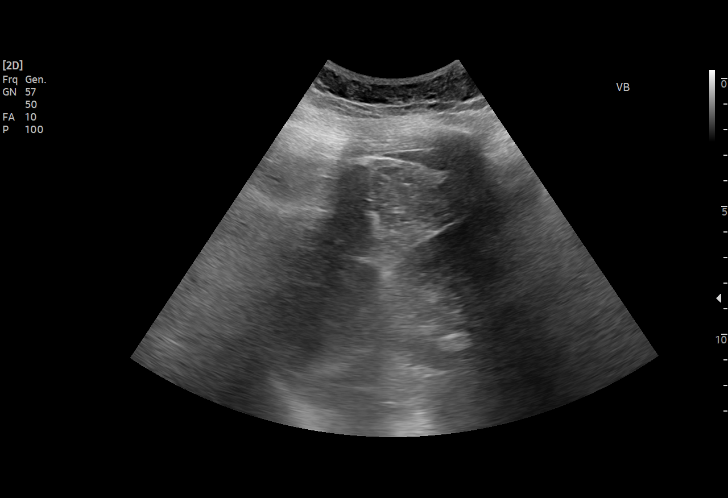
[im 11/36]
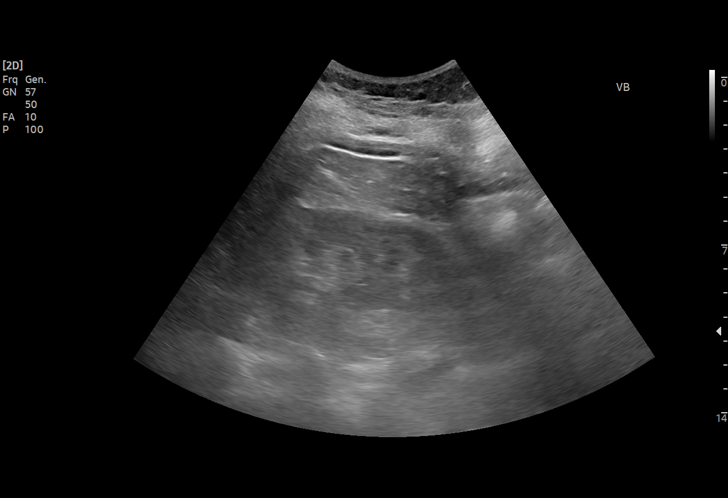
[im 14/36]
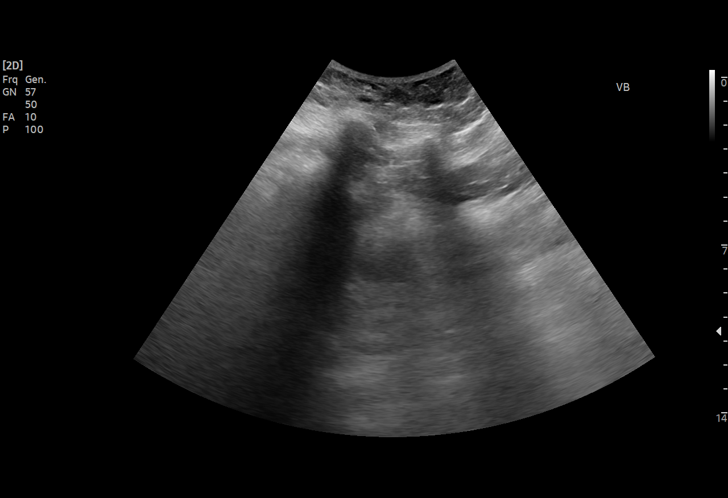
[im 15/36]
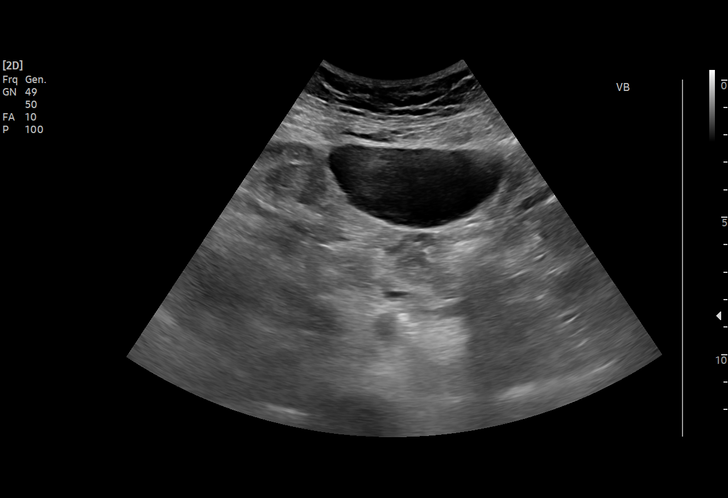
[im 18/36]
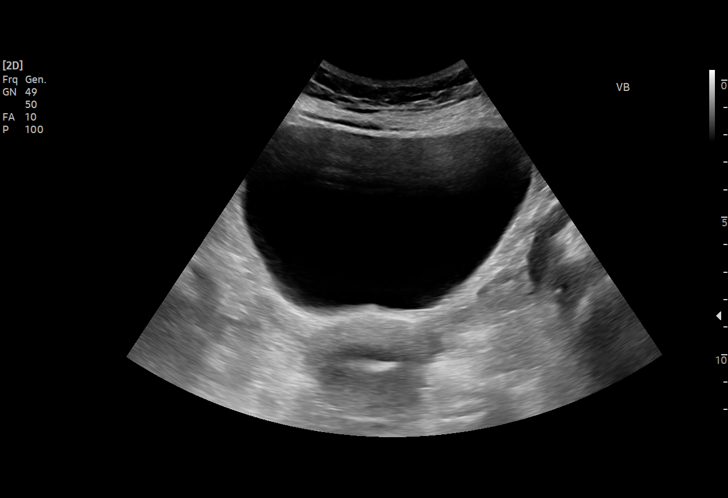
[im 21/36]
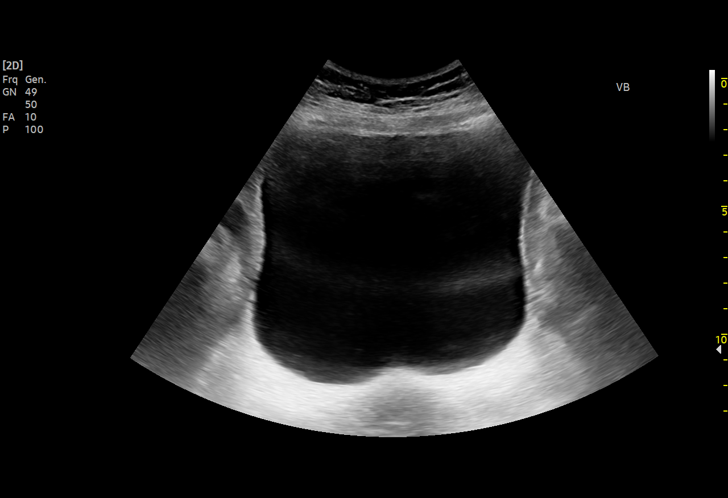
[im 22/36]
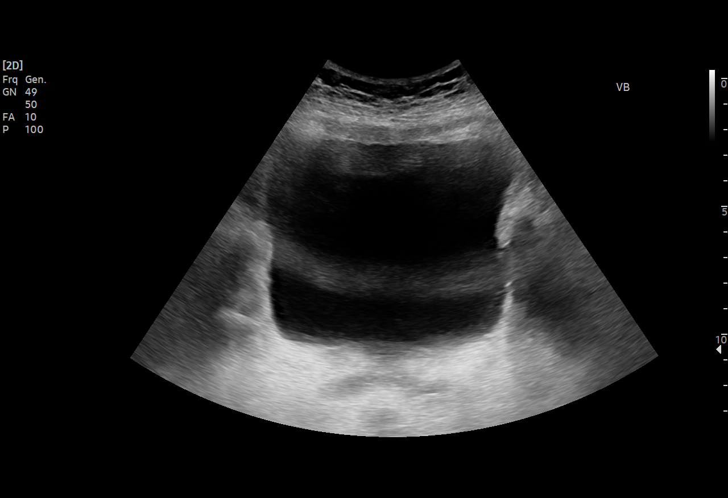
[im 25/36]
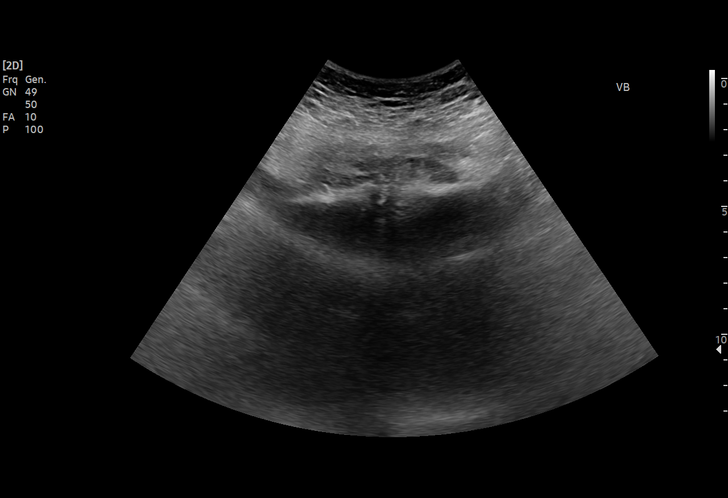
[im 28/36]
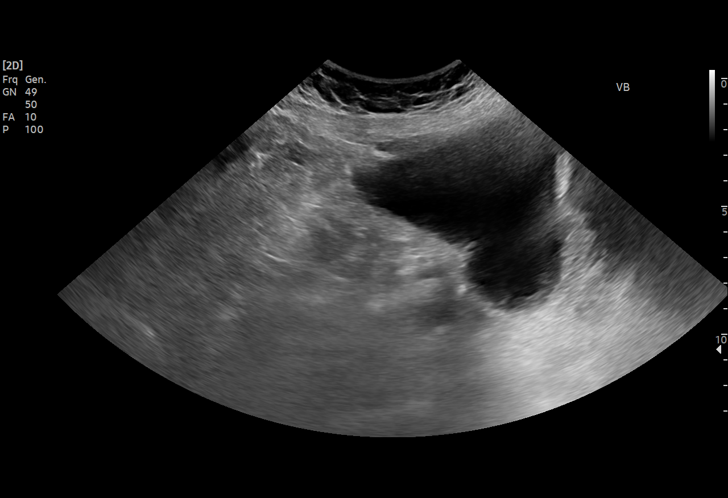
[im 30/36]
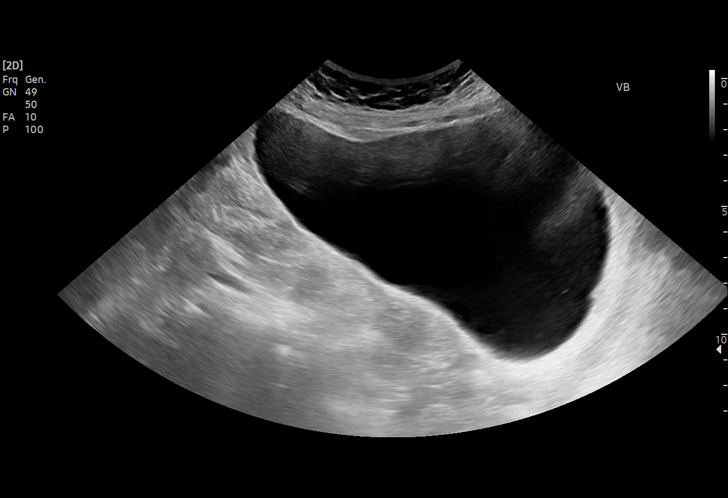
[im 33/36]
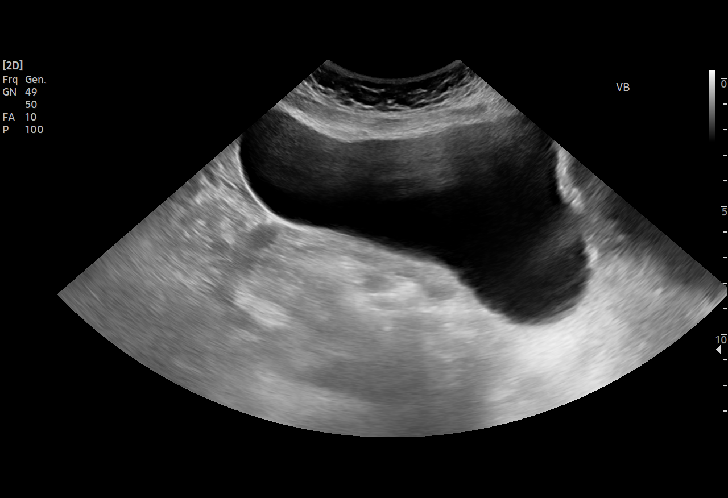
[im 36/36]
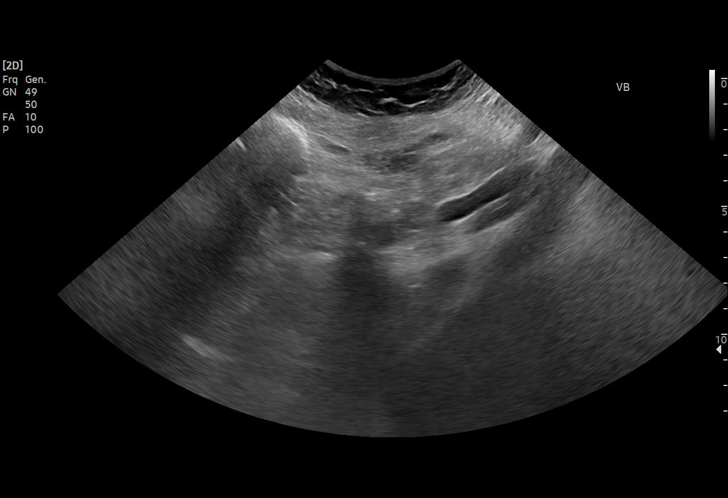

[15 of 25 positions shown; findings below may reference images not displayed]

FINDINGS: Right Kidney:

Renal measurements: 9.3 x 4.7 x 5.0 cm = volume: 113.3 mL.
Echogenicity within normal limits. No mass or hydronephrosis
visualized.

Left Kidney:

Renal measurements: 10.7 x 5.7 x 5.6 cm = volume: 176.0 mL.
Echogenicity within normal limits. No mass or hydronephrosis
visualized.

Bladder:

Appears normal for degree of bladder distention. Bilateral ureteral
jets are seen.

Other:

None.
IMPRESSION: Normal renal ultrasound.

## 2022-07-28 IMAGING — US US ABDOMEN LIMITED
1 series · 14 of 25 positions shown · non-contrast
Comparison: None Available.

CLINICAL DATA: Right upper quadrant pain

EXAM:
ULTRASOUND ABDOMEN LIMITED RIGHT UPPER QUADRANT

[Series 1: us abdomen limited · 0.15mm/px · 14 of 54 slices shown]
[im 1/54]
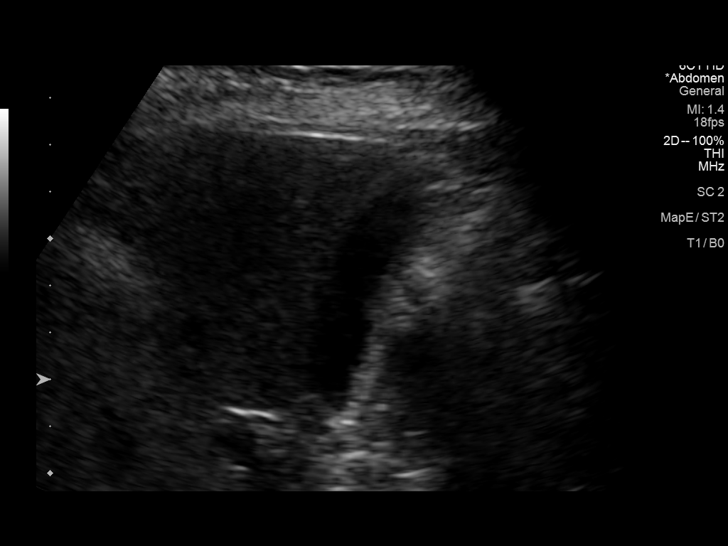
[im 5/54]
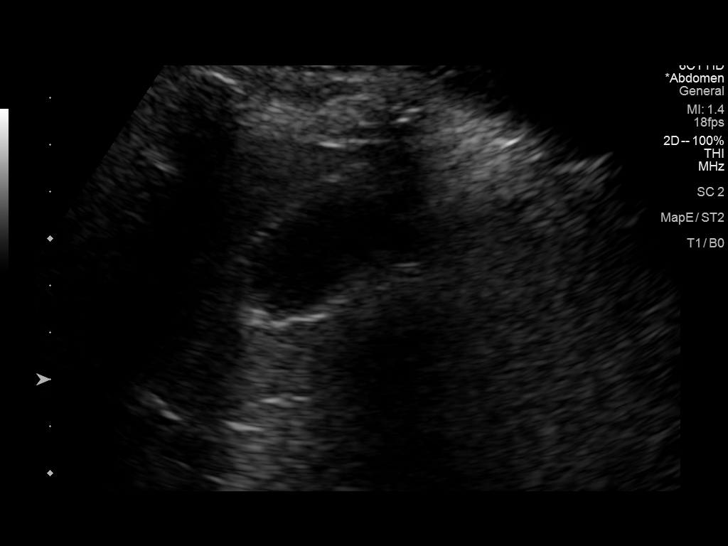
[im 9/54]
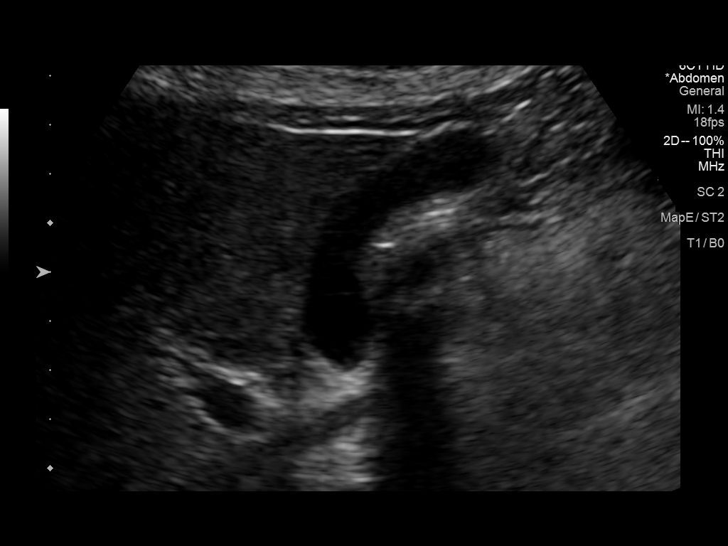
[im 14/54]
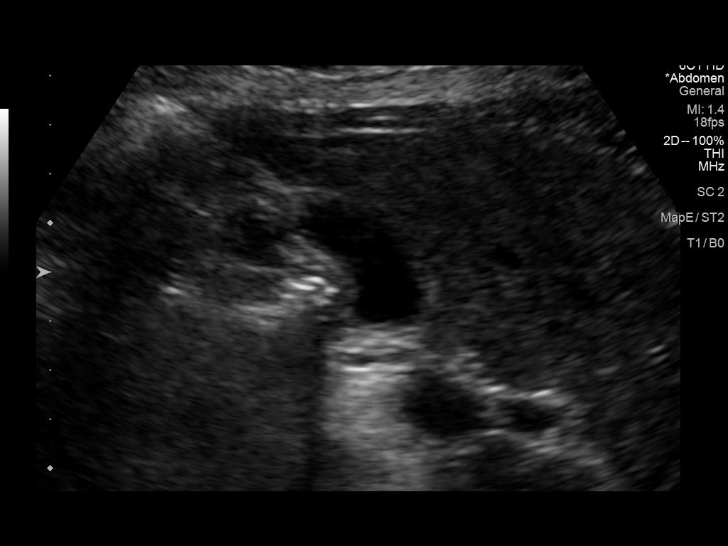
[im 18/54]
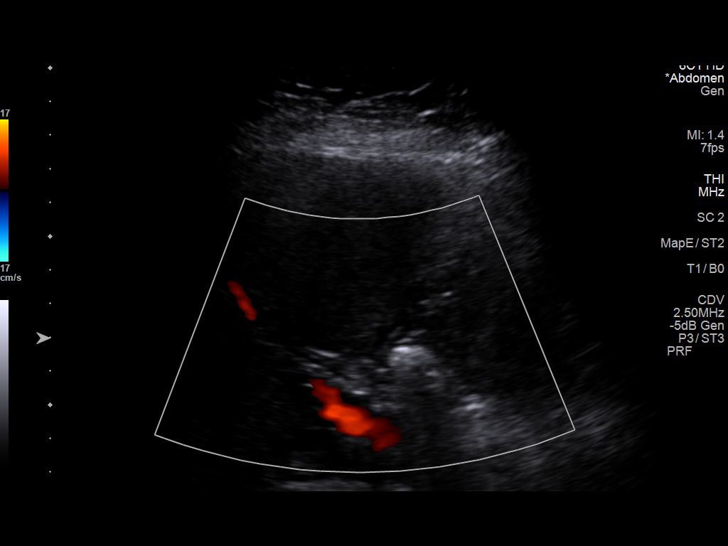
[im 20/54]
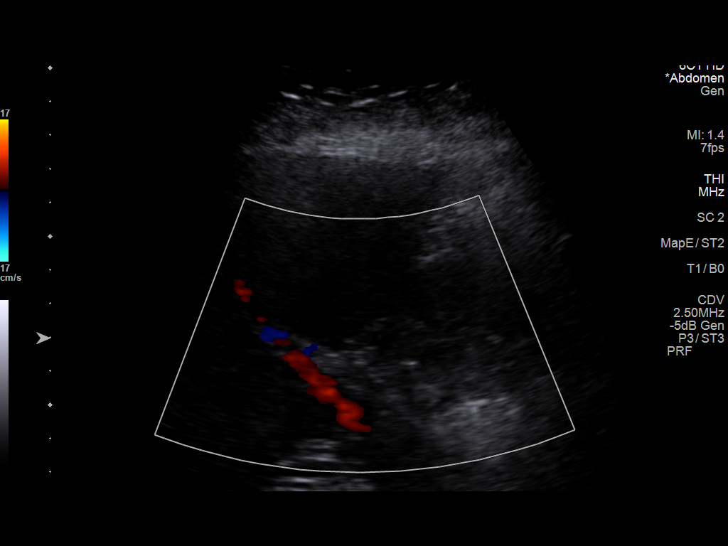
[im 25/54]
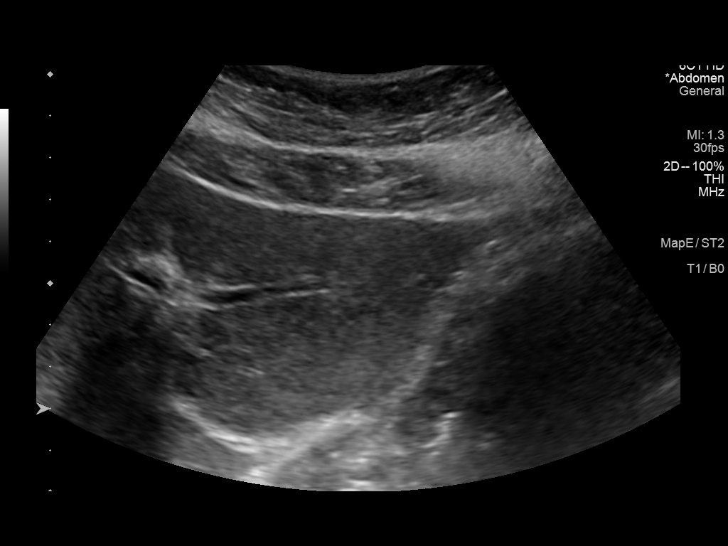
[im 29/54]
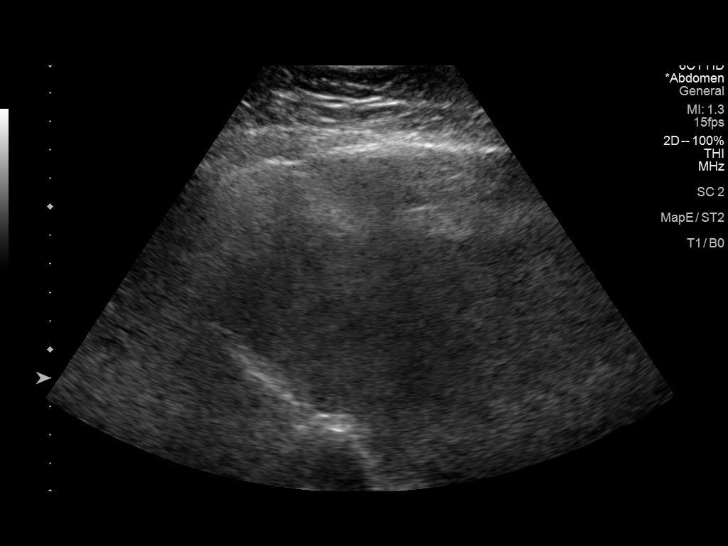
[im 34/54]
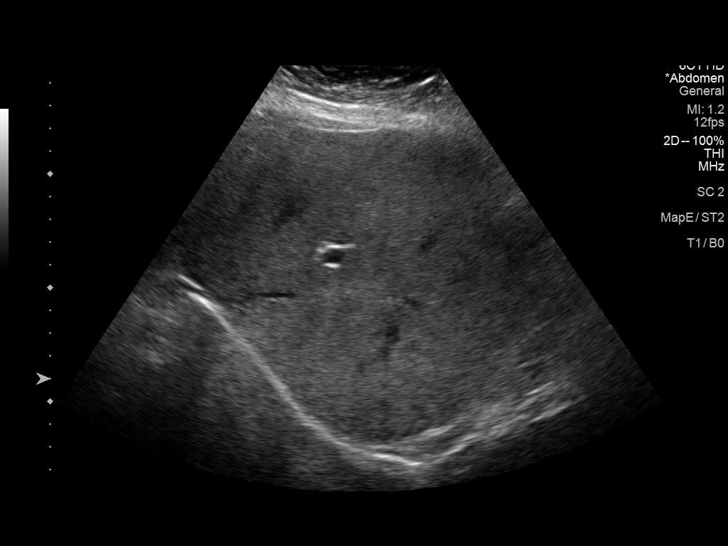
[im 36/54]
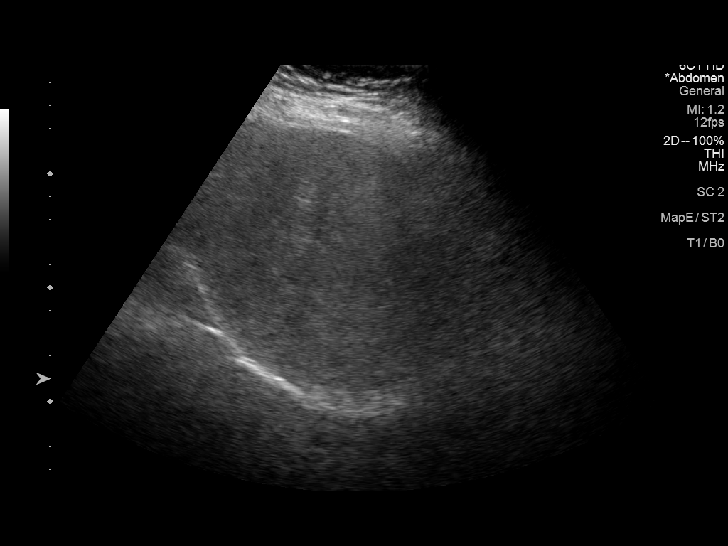
[im 40/54]
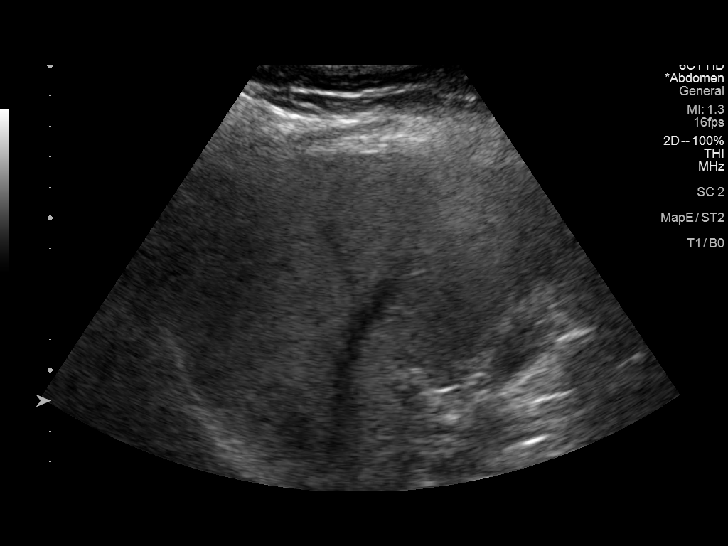
[im 45/54]
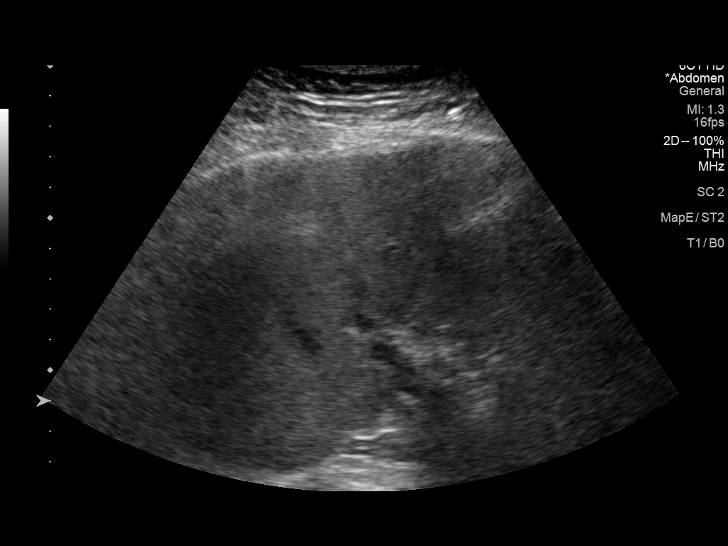
[im 49/54]
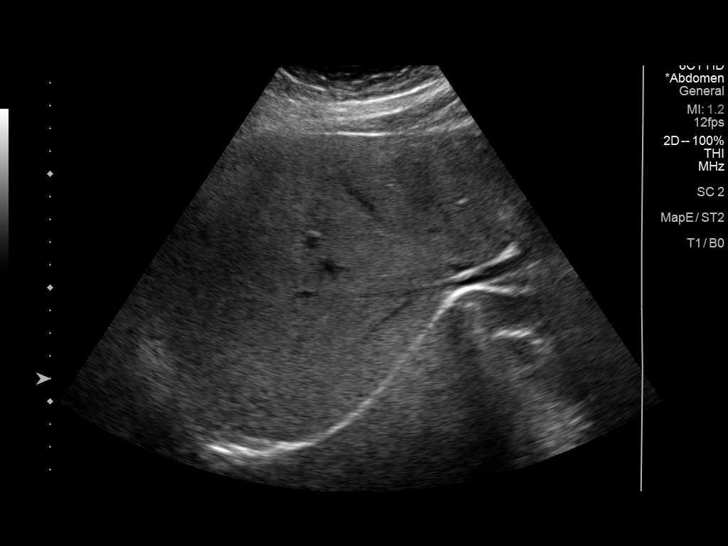
[im 54/54]
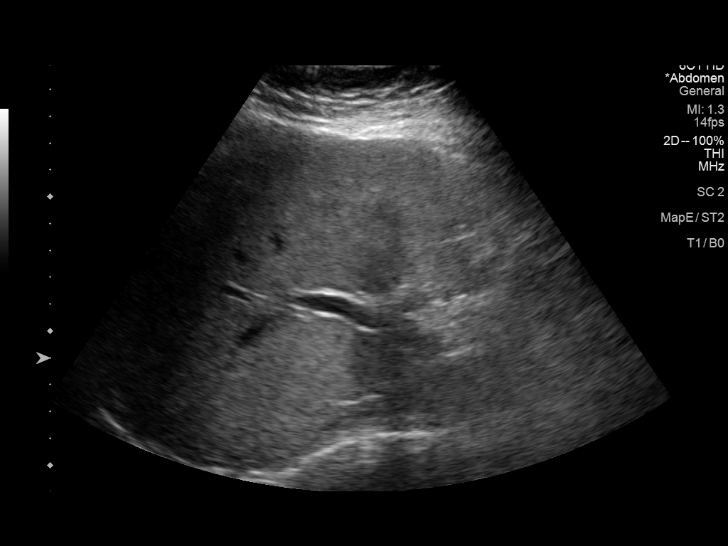

[14 of 25 positions shown; findings below may reference images not displayed]

FINDINGS: Gallbladder:

No gallstones or wall thickening visualized. No sonographic Murphy
sign noted by sonographer.

Common bile duct:

Diameter: 4 mm

Liver:

Increased echogenicity. No focal lesion. Portal vein is patent on
color Doppler imaging with normal direction of blood flow towards
the liver.

Other: None.
IMPRESSION: No cholelithiasis or sonographic evidence for acute cholecystitis.

Increased hepatic parenchymal echogenicity suggestive of steatosis.
